# Patient Record
Sex: Male | Born: 1975 | Race: Black or African American | Hispanic: No | Marital: Married | Smoking: Never smoker
Health system: Southern US, Community
[De-identification: ages and names within clinical notes are randomized; demographics above are authoritative.]

## PROBLEM LIST (undated history)

## (undated) DIAGNOSIS — I10 Essential (primary) hypertension: Secondary | ICD-10-CM

## (undated) HISTORY — PX: APPENDECTOMY: SHX54

## (undated) HISTORY — PX: OTHER SURGICAL HISTORY: SHX169

---

## 2016-10-03 ENCOUNTER — Encounter (HOSPITAL_COMMUNITY): Payer: Self-pay | Admitting: Emergency Medicine

## 2016-10-03 ENCOUNTER — Emergency Department (HOSPITAL_COMMUNITY)
Admission: EM | Admit: 2016-10-03 | Discharge: 2016-10-04 | Disposition: A | Discharge status: Home / Self Care | Payer: BLUE CROSS/BLUE SHIELD | Attending: Emergency Medicine | Admitting: Emergency Medicine

## 2016-10-03 DIAGNOSIS — M542 Cervicalgia: Secondary | ICD-10-CM | POA: Diagnosis present

## 2016-10-03 DIAGNOSIS — M5020 Other cervical disc displacement, unspecified cervical region: Secondary | ICD-10-CM | POA: Insufficient documentation

## 2016-10-03 DIAGNOSIS — I10 Essential (primary) hypertension: Secondary | ICD-10-CM | POA: Diagnosis not present

## 2016-10-03 HISTORY — DX: Essential (primary) hypertension: I10

## 2016-10-03 NOTE — ED Triage Notes (Signed)
Pt states that he started having neck and shoulder pain Monday.  Was seen at [facility] and had x-rays and was told they were unable to find anything wrong.  Saw an orthopaedic physician yesterday and did physical therapy today.  Still having extreme pain

## 2016-10-04 ENCOUNTER — Emergency Department (HOSPITAL_COMMUNITY): Payer: BLUE CROSS/BLUE SHIELD

## 2016-10-04 MED ORDER — PROMETHAZINE HCL 12.5 MG PO TABS
12.5000 mg | ORAL_TABLET | Freq: Once | ORAL | Status: AC
  Administered 2016-10-04: 12.5 mg via ORAL
  Filled 2016-10-04: qty 1

## 2016-10-04 MED ORDER — HYDROMORPHONE HCL 1 MG/ML IJ SOLN
1.0000 mg | Freq: Once | INTRAMUSCULAR | Status: AC
  Administered 2016-10-04: 1 mg via INTRAMUSCULAR
  Filled 2016-10-04: qty 1

## 2016-10-04 MED ORDER — HYDROCODONE-ACETAMINOPHEN 5-325 MG PO TABS
2.0000 | ORAL_TABLET | Freq: Once | ORAL | Status: AC
  Administered 2016-10-04: 2 via ORAL
  Filled 2016-10-04: qty 2

## 2016-10-04 MED ORDER — DIAZEPAM 5 MG PO TABS
10.0000 mg | ORAL_TABLET | Freq: Once | ORAL | Status: AC
  Administered 2016-10-04: 10 mg via ORAL
  Filled 2016-10-04: qty 2

## 2016-10-04 MED ORDER — IBUPROFEN 800 MG PO TABS
800.0000 mg | ORAL_TABLET | Freq: Once | ORAL | Status: AC
  Administered 2016-10-04: 800 mg via ORAL
  Filled 2016-10-04: qty 1

## 2016-10-04 NOTE — Discharge Instructions (Signed)
YOur CT scan suggest small areas of herniated disc of the cervical spine affecting  a structure called the thecal sac. Please continue your current medications. Please use a heating pad to the neck and shoulder area. See your orthopedic MD for additional management of this problem.

## 2016-10-04 NOTE — ED Provider Notes (Signed)
[facility]-EMERGENCY DEPT Provider Note   CSN: [id number] Arrival date & time: 10/03/16  2247     History   Chief Complaint Chief Complaint  Patient presents with  . Shoulder Pain    HPI Sergio Evans is a 41 y.o. male.  Patient is a 41 year old male who presents to the emergency department with a complaint of right shoulder pain.  The patient states that starting Monday, March 5 he began have pain from the mid neck down to the shoulder. This pain has progressed and now he has pain with movement. He has difficulty with sleeping, and he has difficulty with lying down. The patient states he was seen at the [facility], had an x-ray done, and was told that they could find nothing wrong at the time. The patient was later seen at an orthopedic physician office was told that he could possibly have a pinched nerve, and was started on physical therapy. The patient had physical therapy today. The patient states he is still having extreme pain, in spite of using anti-inflammatory medication and pain medication. He presents to the emergency department at this time for assistance with this issue.   The history is provided by the patient.  Shoulder Pain      Past Medical History:  Diagnosis Date  . Hypertension     There are no active problems to display for this patient.   Past Surgical History:  Procedure Laterality Date  . APPENDECTOMY         Home Medications    Prior to Admission medications   Not on File    Family History History reviewed. No pertinent family history.  Social History Social History  Substance Use Topics  . Smoking status: Never Smoker  . Smokeless tobacco: Never Used  . Alcohol use Yes     Comment: occ     Allergies   Patient has no known allergies.   Review of Systems Review of Systems  Musculoskeletal: Positive for arthralgias.  All other systems reviewed and are negative.    Physical Exam Updated Vital Signs BP 162/80 (BP  Location: Left Arm)   Pulse 77   Temp 97.9 F (36.6 C) (Oral)   Resp 18   Ht 6\' 2"  (1.88 m)   Wt 117.9 kg   SpO2 99%   BMI 33.38 kg/m   Physical Exam  Constitutional: He is oriented to person, place, and time. He appears well-developed and well-nourished.  Non-toxic appearance.  HENT:  Head: Normocephalic.  Right Ear: Tympanic membrane and external ear normal.  Left Ear: Tympanic membrane and external ear normal.  Eyes: EOM and lids are normal. Pupils are equal, round, and reactive to light.  Neck: Normal range of motion. Neck supple. Carotid bruit is not present.  No carotid bruits appreciated.  Cardiovascular: Normal rate, regular rhythm, normal heart sounds, intact distal pulses and normal pulses.   Pulmonary/Chest: Breath sounds normal. No respiratory distress.  Abdominal: Soft. Bowel sounds are normal. There is no tenderness. There is no guarding.  Musculoskeletal: Normal range of motion.  There is pain with attempted range of motion of the right shoulder. There is no evidence of any dislocation. There is full range of motion of the right elbow, wrist, and fingers.  There is tightness and tenseness and pain of the right trapezius from the neck extending to the shoulder and down just beyond the scapula. No hot areas appreciated.  Lymphadenopathy:       Head (right side): No submandibular adenopathy present.  Head (left side): No submandibular adenopathy present.    He has no cervical adenopathy.  Neurological: He is alert and oriented to person, place, and time. He has normal strength. No cranial nerve deficit or sensory deficit.  Skin: Skin is warm and dry.  Psychiatric: He has a normal mood and affect. His speech is normal.  Nursing note and vitals reviewed.    ED Treatments / Results  Labs (all labs ordered are listed, but only abnormal results are displayed) Labs Reviewed - No data to display  EKG  EKG Interpretation None       Radiology No results  found.  Procedures Procedures (including critical care time)  Medications Ordered in ED Medications  diazepam (VALIUM) tablet 10 mg (not administered)  ibuprofen (ADVIL,MOTRIN) tablet 800 mg (not administered)  HYDROcodone-acetaminophen (NORCO/VICODIN) 5-325 MG per tablet 2 tablet (not administered)     Initial Impression / Assessment and Plan / ED Course  I have reviewed the triage vital signs and the nursing notes.  Pertinent labs & imaging results that were available during my care of the patient were reviewed by me and considered in my medical decision making (see chart for details).     *I have reviewed nursing notes, vital signs, and all appropriate lab and imaging results for this patient.**  Final Clinical Impressions(s) / ED Diagnoses MDM Vital signs within normal limits.  Patient has a pain, spasm, and at times numbness and tingling from the neck down to the shoulder with certain movement. Plain x-ray of the cervical spine was negative at the [facility] recently on. Will obtain a CT scan of the cervical spine tonight. Patient treated with muscle relaxer, anti-inflammatory medication, and pain medication.  Pt resting after medication, but states the pain has not resolved. CT scan suggest small central disc herniation touching ventral thecal sac at C3-C4. There is a small disc herniation impressing the thecal sac at C4-C5. I have discused the finding with the patient . Pt to take CT to his orthopedic MD and discuss treatment plan. Pt has been prescribed pain medication, steroid, and muscle rrelaxer prior to this ED visit. Questions addressed. Work note given to the patient.   Final diagnoses:  Herniated disc, cervical    New Prescriptions New Prescriptions   No medications on file     Ivery QualeHobson Anastashia Westerfeld, PA-C 10/04/16 0149    Glynn OctaveStephen Rancour, MD 10/04/16 [id number]

## 2018-05-11 ENCOUNTER — Emergency Department (HOSPITAL_COMMUNITY)
Admission: EM | Admit: 2018-05-11 | Discharge: 2018-05-11 | Disposition: A | Discharge status: Home / Self Care | Payer: BLUE CROSS/BLUE SHIELD | Attending: Emergency Medicine | Admitting: Emergency Medicine

## 2018-05-11 ENCOUNTER — Other Ambulatory Visit: Payer: Self-pay

## 2018-05-11 ENCOUNTER — Encounter (HOSPITAL_COMMUNITY): Payer: Self-pay

## 2018-05-11 DIAGNOSIS — M79672 Pain in left foot: Secondary | ICD-10-CM | POA: Diagnosis not present

## 2018-05-11 DIAGNOSIS — I10 Essential (primary) hypertension: Secondary | ICD-10-CM | POA: Diagnosis not present

## 2018-05-11 MED ORDER — DICLOFENAC SODIUM 1 % TD GEL: 2.0000 g | Freq: Four times a day (QID) | TRANSDERMAL | 0 refills | Status: AC

## 2018-05-11 NOTE — ED Provider Notes (Signed)
[facility] EMERGENCY DEPARTMENT Provider Note   CSN: [id number] Arrival date & time: 05/11/18  0935     History   Chief Complaint Chief Complaint  Patient presents with  . Foot Pain    HPI Sergio Evans is a 42 y.o. male resenting for evaluation of left foot pain.  Patient states that the past year, he has been having pain and swelling of his left foot.  He has been seen by his primary care doctor and a foot specialist.  He was at [facility] ER he 2 days ago, he was prescribed prednisone and tramadol.  This has been improving his symptoms, although he has continued pain.  He was told that he will likely need an MRI, this is why he is here today.  He denies trauma or injury.  He denies fevers, chills, or drainage from the area.  He has no other lesions elsewhere.  He denies numbness or tingling.  Is constant, worse with palpation.  Prednisone and tramadol made it better.  No radiation of the pain.  HPI  Past Medical History:  Diagnosis Date  . Hypertension     There are no active problems to display for this patient.   Past Surgical History:  Procedure Laterality Date  . APPENDECTOMY    . NECK FUSION          Home Medications    Prior to Admission medications   Medication Sig Start Date End Date Taking? Authorizing Provider  diclofenac sodium (VOLTAREN) 1 % GEL Apply 2 g topically 4 (four) times daily. 05/11/18   Dreshaun Stene, PA-C    Family History No family history on file.  Social History Social History   Tobacco Use  . Smoking status: Never Smoker  . Smokeless tobacco: Never Used  Substance Use Topics  . Alcohol use: Yes    Comment: occ  . Drug use: No     Allergies   Patient has no known allergies.   Review of Systems Review of Systems  Musculoskeletal: Positive for arthralgias and back pain.  Neurological: Negative for numbness.     Physical Exam Updated Vital Signs BP (!) 166/116 (BP Location: Right Arm)   Pulse 65   Temp 98.1  F (36.7 C) (Oral)   Resp 18   Ht 6\' 1"  (1.854 m)   Wt 122.5 kg   SpO2 99%   BMI 35.62 kg/m   Physical Exam  Constitutional: He is oriented to person, place, and time. He appears well-developed and well-nourished. No distress.  HENT:  Head: Normocephalic and atraumatic.  Eyes: EOM are normal.  Neck: Normal range of motion.  Pulmonary/Chest: Effort normal.  Abdominal: He exhibits no distension.  Musculoskeletal: Normal range of motion. He exhibits edema and tenderness.  Mild swelling and tenderness to the dorsal aspect of the left foot overlying the metatarsals.  No erythema or warmth.  No fluctuance.  Good cap refill of all toes.  Full active range of motion of the ankle and toes without difficulty.  No pain with palpation of the plantar surface of the foot.  No pain in the calf.  Patient is ambulatory.  Neurological: He is alert and oriented to person, place, and time. No sensory deficit.  Skin: Skin is warm. Capillary refill takes less than 2 seconds. No rash noted.  Psychiatric: He has a normal mood and affect.  Nursing note and vitals reviewed.    ED Treatments / Results  Labs (all labs ordered are listed, but only abnormal results  are displayed) Labs Reviewed - No data to display  EKG None  Radiology No results found.  Procedures Procedures (including critical care time)  Medications Ordered in ED Medications - No data to display   Initial Impression / Assessment and Plan / ED Course  I have reviewed the triage vital signs and the nursing notes.  Pertinent labs & imaging results that were available during my care of the patient were reviewed by me and considered in my medical decision making (see chart for details).     Presenting for evaluation of left foot pain which is been present intermittently for the past year.  Physical exam reassuring, he is neurovascularly intact.  No erythema, warmth, fevers, doubt septic joint or infection.  Patient has had multiple  x-rays, does not want repeat x-ray today. I am agreeable to this plan, as doubt new fx. Pt states he is here for an MRI.  I discussed with patient that his condition does not warrant an emergent MRI, and he needs to have an outpatient MRI with either his PCP or foot doctor.  Offered Voltaren gel to help with pain and swelling.  Encouraged follow-up outpatient.  At this time, patient appears safe for discharge.  Return percussions given.  Patient states he understands   Final Clinical Impressions(s) / ED Diagnoses   Final diagnoses:  Left foot pain    ED Discharge Orders         Ordered    diclofenac sodium (VOLTAREN) 1 % GEL  4 times daily     05/11/18 1013           Jakaylee Sasaki, PA-C 05/11/18 1150    Benjiman Core, MD 05/11/18 [phone]

## 2018-05-11 NOTE — ED Triage Notes (Signed)
Pt reports he has had ongoing left foot pain. Pt reports knot to top of left and swelling. Pt reports he has been told in the past  he had gout and fracture. Pain has been off and on for one year. Pain increased this weekend. Went to [facility], given shoe, prednisone and tramadol

## 2018-05-11 NOTE — Discharge Instructions (Signed)
Continue taking prednisone and tramadol. Use Voltaren gel to help with pain and swelling. It is important that you follow-up with your primary care doctor or your foot doctor for further evaluation of your foot. Return to the emergency room with any new, worsening, or concerning symptoms.

## 2018-08-02 IMAGING — CT CT CERVICAL SPINE W/O CM
3 of 4 series · 13 of 33 positions shown, 16 images · non-contrast
Comparison: None.

CLINICAL DATA: Neck and shoulder pain since [REDACTED].

EXAM:
CT CERVICAL SPINE WITHOUT CONTRAST
TECHNIQUE: Multidetector CT imaging of the cervical spine was performed without
intravenous contrast. Multiplanar CT image reconstructions were also
generated.

[Series 5: sagittal bone · sagittal · 0.24mm/px · 5 of 61 slices shown, 6 images]
[im 21/61  bone]
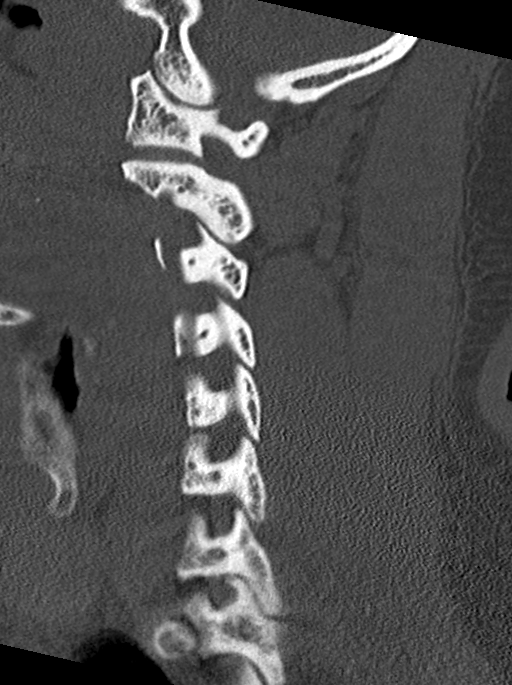
[im 26/61  bone]
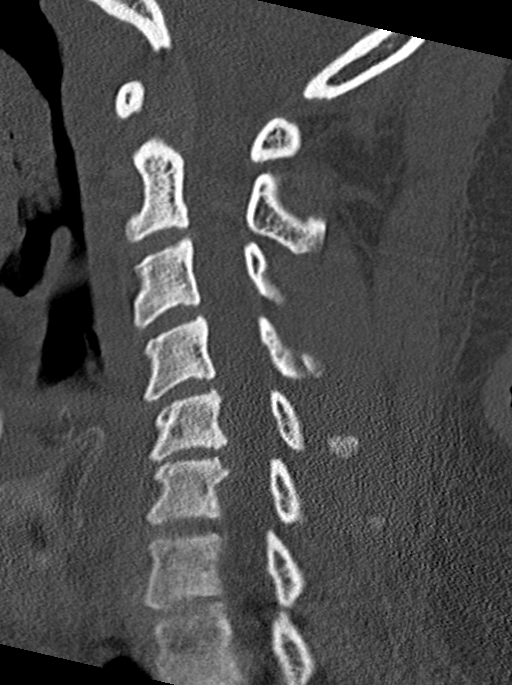
[im 31/61  soft-tissue]
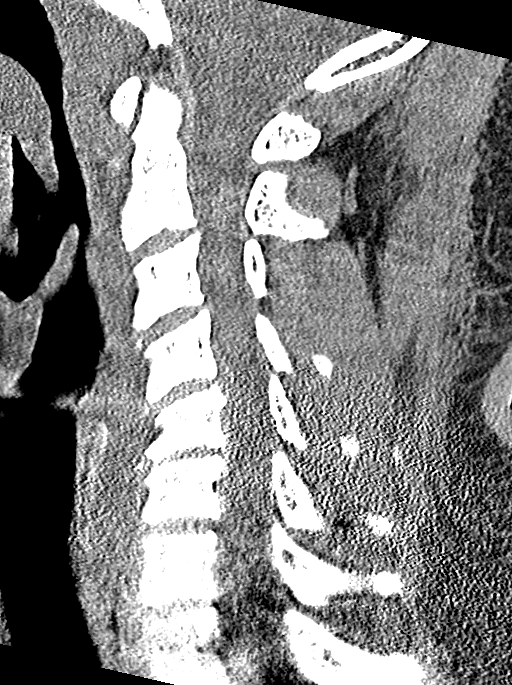
[im 31/61  bone]
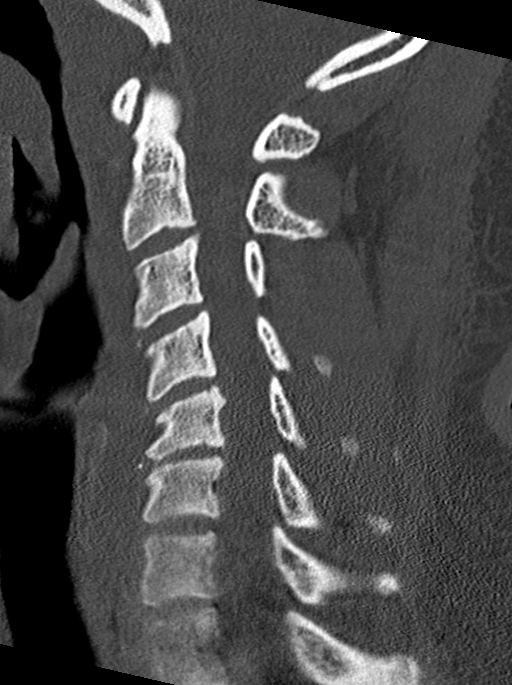
[im 36/61  bone]
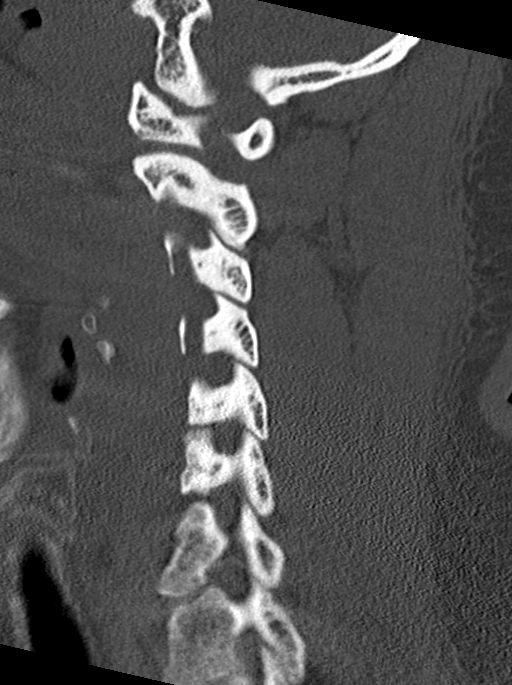
[im 41/61  bone]
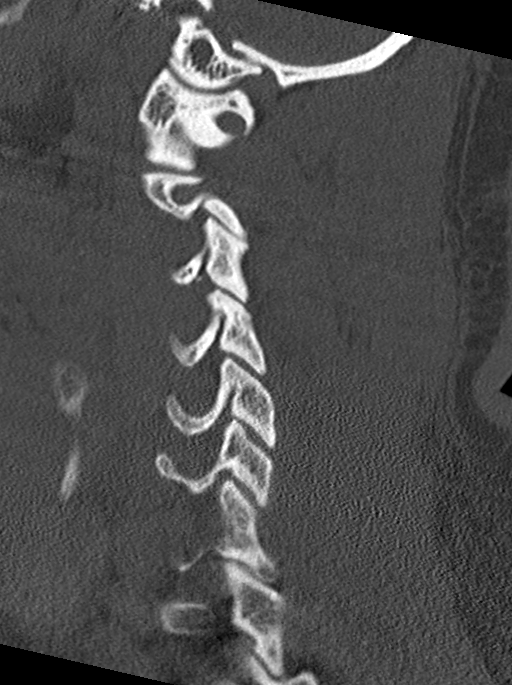

[Series 6: coronal bone · coronal · 0.24mm/px · 3 of 61 slices shown]
[im 13/61  bone]
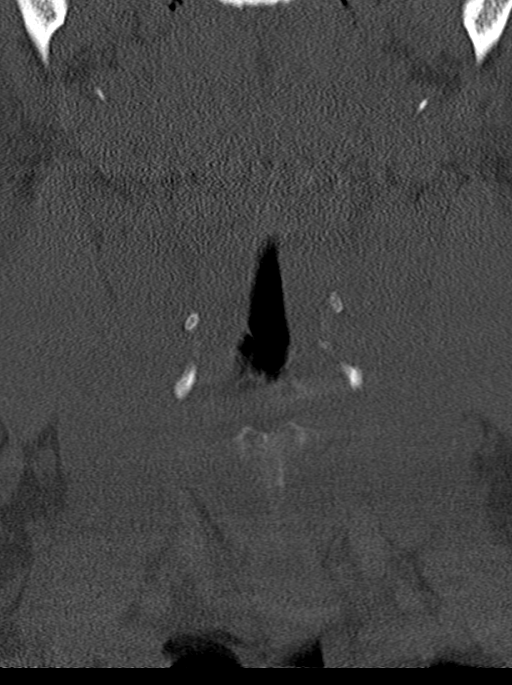
[im 25/61  bone]
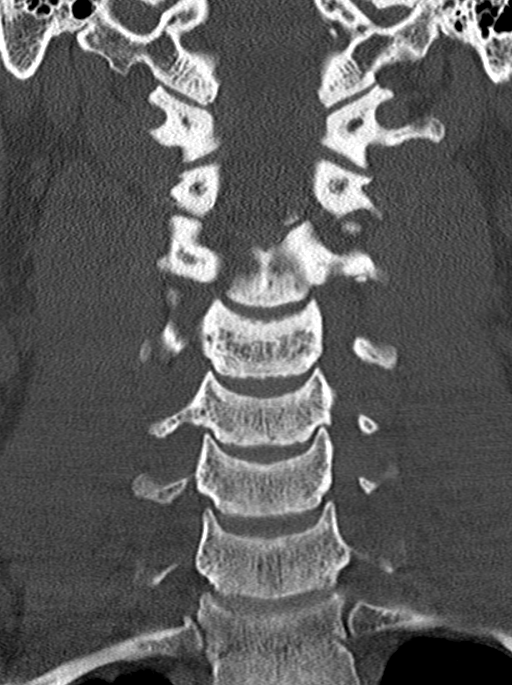
[im 37/61  bone]
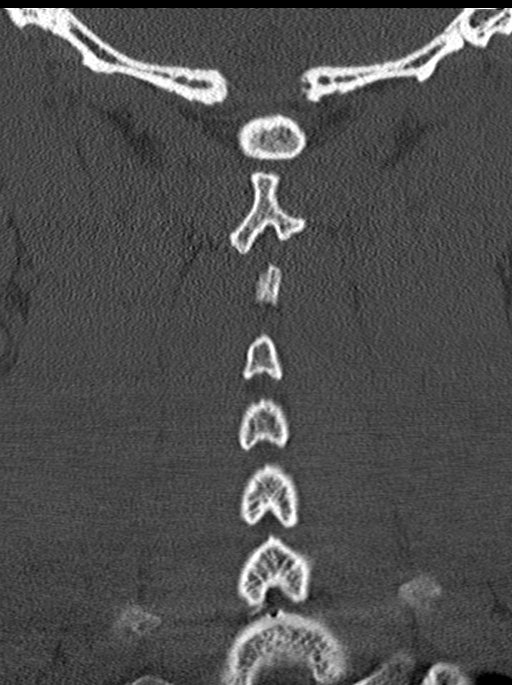

[Series 9: orthogonal bone · axial · 0.21mm/px · z∈[-30,+81]mm · 5 of 85 slices shown, 7 images]
[im 15/85  soft-tissue]
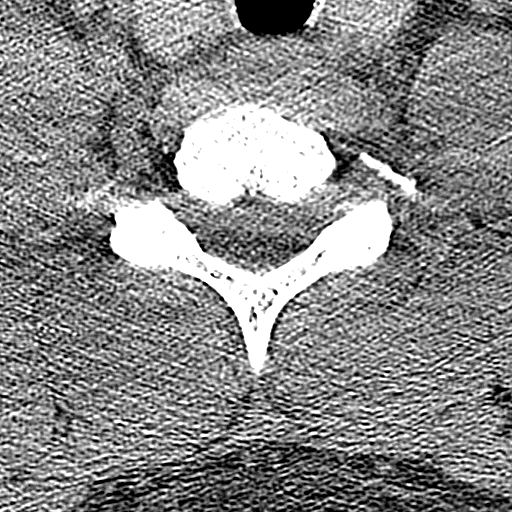
[im 15/85  bone]
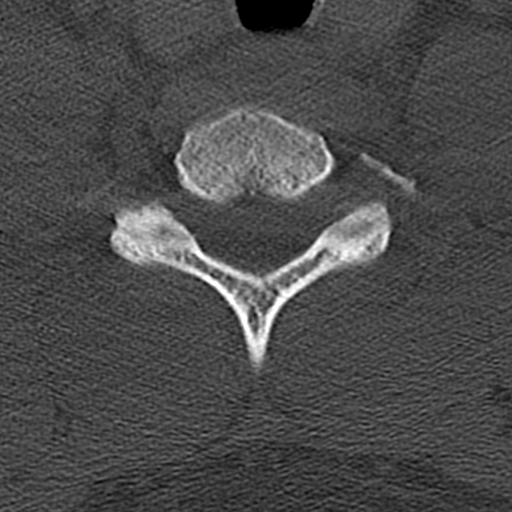
[im 29/85  bone]
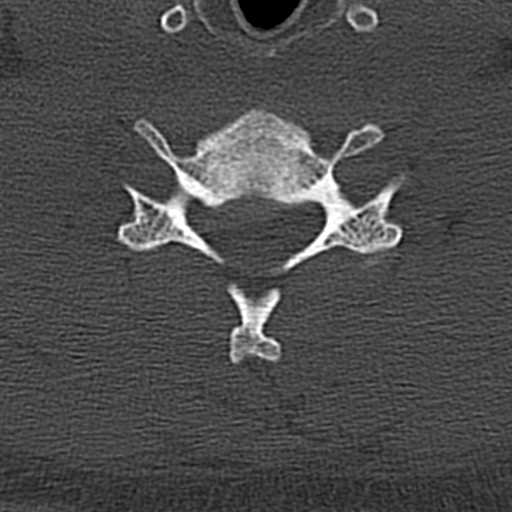
[im 43/85  bone]
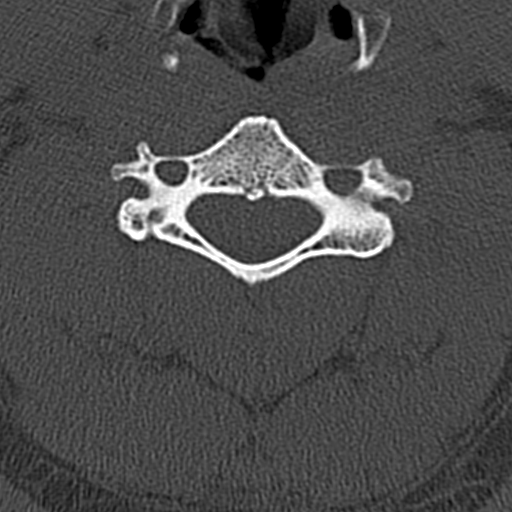
[im 57/85  bone]
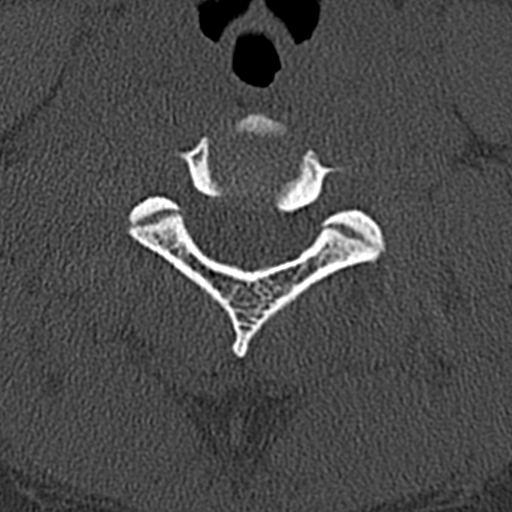
[im 71/85  soft-tissue]
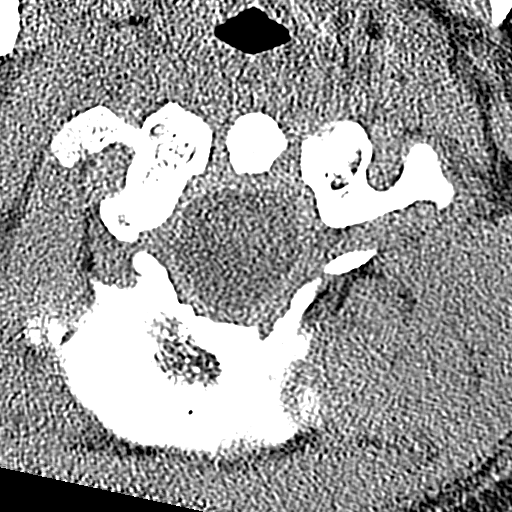
[im 71/85  bone]
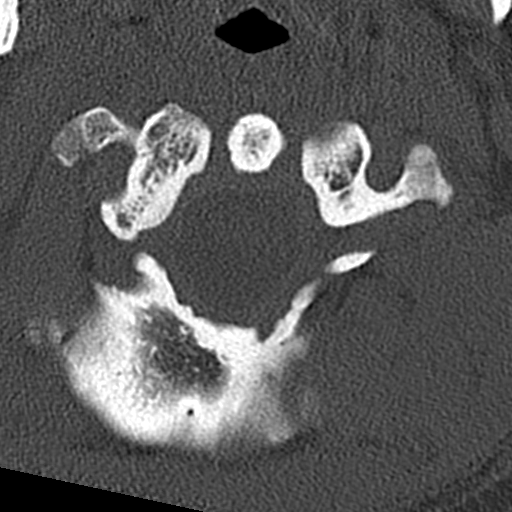

[13 of 33 positions shown; findings below may reference images not displayed]

FINDINGS: Alignment: Slight reversal cervical lordosis which may be secondary
to muscle spasm or patient positioning. The atlantodental interval
is maintained. The craniocervical relationship is intact and
aligned.

Skull base and vertebrae: No skullbase fracture or bone destruction.
No vertebral body fracture.

Soft tissues and spinal canal: .No prevertebral soft tissue
swelling. No intraspinal hematoma. No significant central canal
stenosis or neural foraminal encroachment

Disc levels:

C2-C3:  No focal disc herniation or neural foraminal encroachment.

C3-C4: Small central disc herniation touching upon the ventral
aspect of the thecal sac. No significant neural foraminal
encroachment.

C4-C5: Small left subarticular disc herniation impressing upon the
thecal sac. No extension into the neural foramina. No significant
neural foraminal encroachment or central canal stenosis.

C5-C6: Limited assessment due to streak artifacts from patient
shoulders. No bony osseous canal stenosis or significant neural
foraminal encroachment.

C6-C7: Limited assessment due to patient's shoulders. No bony
osseous canal stenosis. No significant neural foraminal
encroachment.

C7-T1: Limited assessment due to patient shoulders. No bony osseous
canal stenosis or significant neural foraminal encroachment.

Upper chest: Negative.

Other: None.
IMPRESSION: 1. Small central disc herniation at C3-4 touching upon the ventral
aspect of the thecal sac. No canal or significant neural foraminal
encroachment.
2. Small left subarticular disc herniation impressing upon thecal
sac. No significant neural foraminal encroachment or central canal
stenosis.
3. Disc levels caudal to C4-5 are limited due to streak artifacts
patient shoulders.
4. No acute cervical spine fracture or subluxation.
